# Patient Record
Sex: Female | Born: 2005 | Race: White | Hispanic: No | Marital: Single | Smoking: Never smoker
Health system: Southern US, Community
[De-identification: ages and names within clinical notes are randomized; demographics above are authoritative.]

## PROBLEM LIST (undated history)

## (undated) DIAGNOSIS — J45909 Unspecified asthma, uncomplicated: Secondary | ICD-10-CM

## (undated) DIAGNOSIS — F419 Anxiety disorder, unspecified: Secondary | ICD-10-CM

## (undated) DIAGNOSIS — F32A Depression, unspecified: Secondary | ICD-10-CM

## (undated) HISTORY — DX: Anxiety disorder, unspecified: F41.9

## (undated) HISTORY — DX: Unspecified asthma, uncomplicated: J45.909

## (undated) HISTORY — PX: TONSILLECTOMY AND ADENOIDECTOMY: SUR1326

## (undated) HISTORY — DX: Depression, unspecified: F32.A

---
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
## 2023-05-14 DIAGNOSIS — F419 Anxiety disorder, unspecified: Secondary | ICD-10-CM | POA: Diagnosis not present

## 2023-05-14 DIAGNOSIS — F324 Major depressive disorder, single episode, in partial remission: Secondary | ICD-10-CM | POA: Diagnosis not present

## 2023-06-03 DIAGNOSIS — J029 Acute pharyngitis, unspecified: Secondary | ICD-10-CM | POA: Diagnosis not present

## 2023-06-03 DIAGNOSIS — Z1330 Encounter for screening examination for mental health and behavioral disorders, unspecified: Secondary | ICD-10-CM | POA: Diagnosis not present

## 2023-06-24 DIAGNOSIS — N946 Dysmenorrhea, unspecified: Secondary | ICD-10-CM | POA: Diagnosis not present

## 2023-06-24 DIAGNOSIS — F324 Major depressive disorder, single episode, in partial remission: Secondary | ICD-10-CM | POA: Diagnosis not present

## 2023-06-24 DIAGNOSIS — Z30011 Encounter for initial prescription of contraceptive pills: Secondary | ICD-10-CM | POA: Diagnosis not present

## 2023-06-24 DIAGNOSIS — F419 Anxiety disorder, unspecified: Secondary | ICD-10-CM | POA: Diagnosis not present

## 2023-07-08 ENCOUNTER — Encounter: Payer: Self-pay | Admitting: Gastroenterology

## 2023-07-08 ENCOUNTER — Ambulatory Visit (INDEPENDENT_AMBULATORY_CARE_PROVIDER_SITE_OTHER): Payer: BC Managed Care – PPO | Admitting: Gastroenterology

## 2023-07-08 ENCOUNTER — Other Ambulatory Visit (INDEPENDENT_AMBULATORY_CARE_PROVIDER_SITE_OTHER): Payer: BC Managed Care – PPO

## 2023-07-08 VITALS — BP 100/60 | HR 87 | Ht 68.0 in | Wt 150.0 lb

## 2023-07-08 DIAGNOSIS — R109 Unspecified abdominal pain: Secondary | ICD-10-CM | POA: Diagnosis not present

## 2023-07-08 DIAGNOSIS — R1030 Lower abdominal pain, unspecified: Secondary | ICD-10-CM | POA: Diagnosis not present

## 2023-07-08 DIAGNOSIS — R194 Change in bowel habit: Secondary | ICD-10-CM

## 2023-07-08 DIAGNOSIS — R195 Other fecal abnormalities: Secondary | ICD-10-CM | POA: Insufficient documentation

## 2023-07-08 DIAGNOSIS — R110 Nausea: Secondary | ICD-10-CM | POA: Diagnosis not present

## 2023-07-08 DIAGNOSIS — Z8379 Family history of other diseases of the digestive system: Secondary | ICD-10-CM

## 2023-07-08 LAB — CBC
HCT: 37.3 % (ref 36.0–49.0)
Hemoglobin: 12.7 g/dL (ref 12.0–16.0)
MCHC: 34 g/dL (ref 31.0–37.0)
MCV: 86 fL (ref 78.0–98.0)
Platelets: 226 10*3/uL (ref 150.0–575.0)
RBC: 4.33 Mil/uL (ref 3.80–5.70)
RDW: 12.9 % (ref 11.4–15.5)
WBC: 4.9 10*3/uL (ref 4.5–13.5)

## 2023-07-08 LAB — AMYLASE: Amylase: 52 U/L (ref 27–131)

## 2023-07-08 LAB — COMPREHENSIVE METABOLIC PANEL
ALT: 10 U/L (ref 0–35)
AST: 16 U/L (ref 0–37)
Albumin: 4.4 g/dL (ref 3.5–5.2)
Alkaline Phosphatase: 46 U/L — ABNORMAL LOW (ref 47–119)
BUN: 10 mg/dL (ref 6–23)
CO2: 28 meq/L (ref 19–32)
Calcium: 9.2 mg/dL (ref 8.4–10.5)
Chloride: 103 meq/L (ref 96–112)
Creatinine, Ser: 0.89 mg/dL (ref 0.40–1.20)
GFR: 94.22 mL/min (ref >60.00)
Glucose, Bld: 87 mg/dL (ref 70–99)
Potassium: 3.8 meq/L (ref 3.5–5.1)
Sodium: 138 meq/L (ref 135–145)
Total Bilirubin: 0.4 mg/dL (ref 0.2–1.2)
Total Protein: 7.2 g/dL (ref 6.0–8.3)

## 2023-07-08 LAB — TSH: TSH: 0.86 u[IU]/mL (ref 0.40–5.00)

## 2023-07-08 LAB — LIPASE: Lipase: 25 U/L (ref 11.0–59.0)

## 2023-07-08 MED ORDER — HYOSCYAMINE SULFATE 0.125 MG SL SUBL: 0.1250 mg | SUBLINGUAL_TABLET | Freq: Three times a day (TID) | SUBLINGUAL | 1 refills | Status: DC | PRN

## 2023-07-08 NOTE — Patient Instructions (Signed)
You have been scheduled for an abdominal ultrasound at [facility] (1st floor of hospital) on 07/10/23 at 8:00 am . Please arrive 30 minutes prior to your appointment for registration. Make certain not to have anything to eat or drink 6 hours prior to your appointment. Should you need to reschedule your appointment, please contact radiology at [phone]. This test typically takes about 30 minutes to perform.  Your provider has requested that you go to the basement level for lab work before leaving today. Press "B" on the elevator. The lab is located at the first door on the left as you exit the elevator.  Your provider has ordered "Diatherix" stool testing for you. You have received a kit from our office today containing all necessary supplies to complete this test. Please carefully read the stool collection instructions provided in the kit before opening the accompanying materials. In addition, be sure to place the label from the top right corner of the laboratory request sheet onto the "puritan opti-swab" tube that is supplied in the kit. This label should include your full name and date of birth. After completing the test, you should secure the purtian tube into the specimen biohazard bag. The laboratory request information sheet (including date and time of specimen collection) should be placed into the outside pocket of the specimen biohazard bag and returned to the [place] lab with 2 days of collection.   If the laboratory information sheet specimen date and time are not filled out, the test will NOT be performed.  Due to recent changes in healthcare laws, you may see the results of your imaging and laboratory studies on MyChart before your provider has had a chance to review them.  We understand that in some cases there may be results that are confusing or concerning to you. Not all laboratory results come back in the same time frame and the provider may be waiting for multiple results in  order to interpret others.  Please give [place] 48 hours in order for your provider to thoroughly review all the results before contacting the office for clarification of your results.   _______________________________________________________  If your blood pressure at your visit was 140/90 or greater, please contact your primary care physician to follow up on this.  _______________________________________________________  If you are age 65 or older, your body mass index should be between 23-30. Your Body mass index is 22.81 kg/m. If this is out of the aforementioned range listed, please consider follow up with your Primary Care Provider.  If you are age 65 or younger, your body mass index should be between 19-25. Your Body mass index is 22.81 kg/m. If this is out of the aformentioned range listed, please consider follow up with your Primary Care Provider.   ________________________________________________________  The [facility] providers would like to encourage you to use [facility] to communicate with providers for non-urgent requests or questions.  Due to long hold times on the telephone, sending your provider a message by [facility] may be a faster and more efficient way to get a response.  Please allow 48 business hours for a response.  Please remember that this is for non-urgent requests.  _______________________________________________________  Thank you for choosing me and [facility].  Dr. Meridee Score

## 2023-07-08 NOTE — Progress Notes (Signed)
GASTROENTEROLOGY OUTPATIENT CLINIC VISIT   Primary Care Provider [place], Victorino Dike, MD [address] [place] [address] [phone]  Referring Provider Ronney Asters, MD [address] [place],  [place] [address] [phone]  Patient Profile: Lindsay Lester is a 17 y.o. female with a pmh significant for anxiety, MDD, allergies.  The patient presents to the [facility] for an evaluation and management of problem(s) noted below:  Problem List 1. Lower abdominal pain   2. Abdominal cramping   3. Dark stools   4. Change in bowel habits   5. Nausea   6. Family history of celiac disease    Discussed the use of AI scribe software for clinical note transcription with the patient, who gave verbal consent to proceed.  History of Present Illness This is the patient's first visit to the [facility] GI clinic.  The patient presents with her father.  She has been experiencing a significant recurrent abdominal discomfort for the last 3-weeks.  She reports that about 1 week prior to Thanksgiving she began to experience pain and cramping in the lower abdomen.  This is different that her "period cramps".  This discomfort could last a few minutes or at times in the evening even a few hours.  She had been taking NSAID therapy for the discomfort of her period pain during this time, but not more than what she does normally.  She noted a change in her bowel habits for which she felt constipation was occurring and she had concerns of diarrhea overflow.  She self-administered MiraLAX and then a couple of enemas with improvement in her bowel passage, but it did not improve her symptoms of pain/discomfort.  PPI OTC trial was performed but not clear it made a difference for her.  She went back to school, feeling somewhat worse with regards to the pain.  She initiated an OCP with her PCP to see if that would help with her period discomfort.  She has experienced at times some nausea  and an episode of vomiting, but this is somewhat improved currently.  Food intake/fasting makes no difference for the pain.  This is located in the lower abdomen without radiation to the flanks or back (right at/below her umbilicus per her notation).  Prior to this episode, the patient states that she had no history of similar abdominal pain, apart from menstrual cramps.  Bloating and abdominal gas are intermittent issues she also still deals with at times.  Her sister was diagnosed with Celiac disease years ago, but patient has never formally been tested per report, and she does not follow a GFD.  Patient is still able to do all of her ADLs and feels that some of these issues may be stress related from life stressors of school and other things, but she wants to be sure nothing else more serious is occurring either, her family agrees.   GI Review of Systems Positive as above Negative for dysphagia, odynophagia, hematochezia, early satiety  Review of Systems General: Denies fevers/chills/weight loss unintentionally HEENT: Denies oral lesions Cardiovascular: Denies chest pain Pulmonary: Denies shortness of breath Gastroenterological: See HPI Genitourinary: Denies darkened urine Hematological: Denies easy bruising/bleeding Endocrine: Denies temperature intolerance Dermatological: Denies jaundice Psychological: Mood is stable   Medications Current Outpatient Medications  Medication Sig Dispense Refill   cetirizine (ZYRTEC) 10 MG tablet Take 10 mg by mouth at bedtime.     FLUoxetine (PROZAC) 40 MG capsule Take 40 mg by mouth daily.     hydrOXYzine (ATARAX)  25 MG tablet Take 25 mg by mouth at bedtime.     hyoscyamine (LEVSIN/SL) 0.125 MG SL tablet Place 1 tablet (0.125 mg total) under the tongue 3 (three) times daily as needed. 20 tablet 1   norgestimate-ethinyl estradiol (ORTHO-CYCLEN) 0.25-35 MG-MCG tablet Take 1 tablet by mouth daily.     No current facility-administered medications for this  visit.    Allergies Allergies  Allergen Reactions   Peanut-Containing Drug Products Anaphylaxis and Nausea And Vomiting    Pt allergic to all nuts!    Histories Past Medical History:  Diagnosis Date   Anxiety    Asthma    Depression    Past Surgical History:  Procedure Laterality Date   TONSILLECTOMY AND ADENOIDECTOMY     as a baby   Social History   Socioeconomic History   Marital status: Single    Spouse name: Not on file   Number of children: 0   Years of education: Not on file   Highest education level: Not on file  Occupational History   Occupation: college student  Tobacco Use   Smoking status: Never   Smokeless tobacco: Never  Vaping Use   Vaping status: Never Used  Substance and Sexual Activity   Alcohol use: No   Drug use: No   Sexual activity: Not on file  Other Topics Concern   Not on file  Social History Narrative   Not on file   Social Determinants of Health   Financial Resource Strain: Not on file  Food Insecurity: Not on file  Transportation Needs: Not on file  Physical Activity: Not on file  Stress: Not on file  Social Connections: Not on file  Intimate Partner Violence: Not on file   Family History  Problem Relation Age of Onset   Celiac disease Sister    Other Sister        allergic to peanuts   Colon cancer Neg Hx    Esophageal cancer Neg Hx    Inflammatory bowel disease Neg Hx    Liver disease Neg Hx    Pancreatic cancer Neg Hx    Rectal cancer Neg Hx    Stomach cancer Neg Hx    I have reviewed her medical, social, and family history in detail and updated the electronic medical record as necessary.    PHYSICAL EXAMINATION  BP 100/60   Pulse 87   Ht 5\' 8"  (1.727 m)   Wt 150 lb (68 kg)   SpO2 97%   BMI 22.81 kg/m  Wt Readings from Last 3 Encounters:  07/08/23 150 lb (68 kg) (82%, Z= 0.91)*  01/26/17 103 lb 6.4 oz (46.9 kg) (61%, Z= 0.28)*  12/27/15 83 lb 8.9 oz (37.9 kg) (41%, Z= -0.22)*   * Growth percentiles are  based on CDC (Girls, 2-20 Years) data.  GEN: NAD, appears stated age, doesn't appear chronically ill, accompanied by father, mother on the phone PSYCH: Cooperative, without pressured speech EYE: Conjunctivae pink, sclerae anicteric ENT: MMM CV: Nontachycardic RESP: No audible wheezing GI: NABS, soft, NT/ND, without rebound or guarding, no HSM appreciated MSK/EXT: No significant lower extremity edema SKIN: No jaundice NEURO:  Alert & Oriented x 3, no focal deficits   REVIEW OF DATA  I reviewed the following data at the time of this encounter:  GI Procedures and Studies  No relevant studies to review  Laboratory Studies  Reviewed those in epic and care everywhere  Imaging Studies  No relevant studies to review   ASSESSMENT  Ms. Tregre is a 17 y.o. female with a pmh significant for anxiety, MDD, allergies.  The patient is seen today for evaluation and management of:  1. Lower abdominal pain   2. Abdominal cramping   3. Dark stools   4. Change in bowel habits   5. Nausea   6. Family history of celiac disease    The patient is hemodynamically stable.  Clinically, this patient has had a significant development of discomfort over the course the last few weeks.  Based on the patient's underlying history, most likely this will end up being some sort of functional abdominal discomfort and pain as a result of underlying life stressors surrounding things.  With that being said things will be monitored over the course the next few weeks while she is off of school and back at home.  I think a diagnostic workup is reasonable.  With the family history of celiac disease, even though patient does not have regular symptoms that are uncomfortable, it is reasonable for her to be screened to rule out celiac.  We are going to do other laboratories to rule out potentially treatable causes from a metabolic standpoint.  With the issues of recent dark stools, consideration of upper endoscopy to rule out  PUD should be had, but she has normalized her bowels at this point and wants to hold on upper endoscopy, for now at least.  Will give patient antispasmodic in effort of trying to see if that will make a difference for her symptoms.  If her laboratories and stool studies and abdominal ultrasound are unremarkable, can consider cross-sectional imaging as well as diagnostic endoscopy.  Not clear diagnostic colonoscopy is required at this time unless changes in bowel continues to be an issue.  It will be interesting if patient sees difference being on an OCP at this time.  All patient questions were answered to the best of my ability, and the patient agrees to the aforementioned plan of action with follow-up as indicated.   PLAN  Laboratories as outlined below Abdominal ultrasound to be obtained Stool studies as outlined below Initiate Levsin 0.125 mg 3 times daily as needed abdominal cramping/discomfort - May use a second dose within 15 to 30 minutes of initial dose if needed and if pain is persisting - If helpful, we will send in further prescriptions Diagnostic endoscopy can be considered and was recommended, but patient and family would like to initiate this workup first before pursuing endoscopy - If celiac serologies are elevated, will strongly recommend upper endoscopy Holding on cross-sectional imaging for now No plan for colonoscopy currently Could consider SIBO breath testing in future It will be interesting to see what happens with patient now being on an OCP if this makes any difference for her   Orders Placed This Encounter  Procedures   US Abdomen Complete   CBC   Comp Met (CMET)   Amylase   Lipase   TSH   IgA   Tissue transglutaminase, IgA   Pancreatic elastase, fecal   Other/Misc lab test    New Prescriptions   HYOSCYAMINE (LEVSIN/SL) 0.125 MG SL TABLET    Place 1 tablet (0.125 mg total) under the tongue 3 (three) times daily as needed.   Modified Medications   No  medications on file    Planned Follow Up No follow-ups on file.   Total Time in Face-to-Face and in Coordination of Care for patient including independent/personal interpretation/review of prior testing, medical history, examination, medication adjustment, communicating results  with the patient directly, and documentation within the EHR is 45 minutes.   Corliss Parish, MD [facility] Office # [phone]

## 2023-07-09 LAB — IGA: Immunoglobulin A: 163 mg/dL (ref 47–310)

## 2023-07-09 LAB — TISSUE TRANSGLUTAMINASE, IGA: (tTG) Ab, IgA: <1 U/mL

## 2023-07-10 ENCOUNTER — Ambulatory Visit (HOSPITAL_COMMUNITY)
Admission: RE | Admit: 2023-07-10 | Discharge: 2023-07-10 | Disposition: A | Discharge status: Home / Self Care | Payer: BC Managed Care – PPO | Source: Ambulatory Visit | Attending: Gastroenterology | Admitting: Gastroenterology

## 2023-07-10 ENCOUNTER — Encounter: Payer: Self-pay | Admitting: Gastroenterology

## 2023-07-10 DIAGNOSIS — Z8379 Family history of other diseases of the digestive system: Secondary | ICD-10-CM

## 2023-07-10 DIAGNOSIS — R194 Change in bowel habit: Secondary | ICD-10-CM

## 2023-07-10 DIAGNOSIS — R195 Other fecal abnormalities: Secondary | ICD-10-CM | POA: Diagnosis not present

## 2023-07-10 DIAGNOSIS — R110 Nausea: Secondary | ICD-10-CM | POA: Diagnosis not present

## 2023-07-10 DIAGNOSIS — R1030 Lower abdominal pain, unspecified: Secondary | ICD-10-CM | POA: Diagnosis not present

## 2023-07-10 DIAGNOSIS — R109 Unspecified abdominal pain: Secondary | ICD-10-CM | POA: Diagnosis not present

## 2023-07-11 ENCOUNTER — Encounter (HOSPITAL_COMMUNITY): Payer: Self-pay | Admitting: Emergency Medicine

## 2023-07-11 ENCOUNTER — Emergency Department (HOSPITAL_COMMUNITY): Payer: BC Managed Care – PPO

## 2023-07-11 ENCOUNTER — Encounter: Payer: Self-pay | Admitting: Gastroenterology

## 2023-07-11 ENCOUNTER — Other Ambulatory Visit: Payer: Self-pay

## 2023-07-11 ENCOUNTER — Emergency Department (HOSPITAL_COMMUNITY)
Admission: EM | Admit: 2023-07-11 | Discharge: 2023-07-11 | Disposition: A | Discharge status: Home / Self Care | Payer: BC Managed Care – PPO | Attending: Emergency Medicine | Admitting: Emergency Medicine

## 2023-07-11 DIAGNOSIS — Z91010 Allergy to peanuts: Secondary | ICD-10-CM | POA: Diagnosis not present

## 2023-07-11 DIAGNOSIS — R112 Nausea with vomiting, unspecified: Secondary | ICD-10-CM | POA: Insufficient documentation

## 2023-07-11 DIAGNOSIS — R1031 Right lower quadrant pain: Secondary | ICD-10-CM | POA: Diagnosis not present

## 2023-07-11 DIAGNOSIS — R1011 Right upper quadrant pain: Secondary | ICD-10-CM | POA: Insufficient documentation

## 2023-07-11 DIAGNOSIS — R1030 Lower abdominal pain, unspecified: Secondary | ICD-10-CM

## 2023-07-11 LAB — COMPREHENSIVE METABOLIC PANEL
ALT: 14 U/L (ref 0–44)
AST: 18 U/L (ref 15–41)
Albumin: 4.2 g/dL (ref 3.5–5.0)
Alkaline Phosphatase: 46 U/L (ref 38–126)
Anion gap: 11 (ref 5–15)
BUN: 10 mg/dL (ref 6–20)
CO2: 25 mmol/L (ref 22–32)
Calcium: 9.5 mg/dL (ref 8.9–10.3)
Chloride: 104 mmol/L (ref 98–111)
Creatinine, Ser: 0.95 mg/dL (ref 0.44–1.00)
GFR, Estimated: >60 mL/min (ref >60)
Glucose, Bld: 100 mg/dL — ABNORMAL HIGH (ref 70–99)
Potassium: 3.7 mmol/L (ref 3.5–5.1)
Sodium: 140 mmol/L (ref 135–145)
Total Bilirubin: 0.8 mg/dL (ref <1.2)
Total Protein: 7.4 g/dL (ref 6.5–8.1)

## 2023-07-11 LAB — WET PREP, GENITAL
Clue Cells Wet Prep HPF POC: NONE SEEN
Sperm: NONE SEEN
Trich, Wet Prep: NONE SEEN
WBC, Wet Prep HPF POC: >=10 — AB (ref <10)
Yeast Wet Prep HPF POC: NONE SEEN

## 2023-07-11 LAB — URINALYSIS, ROUTINE W REFLEX MICROSCOPIC
Bilirubin Urine: NEGATIVE
Glucose, UA: NEGATIVE mg/dL
Hgb urine dipstick: NEGATIVE
Ketones, ur: NEGATIVE mg/dL
Leukocytes,Ua: NEGATIVE
Nitrite: NEGATIVE
Protein, ur: NEGATIVE mg/dL
Specific Gravity, Urine: >1.03 — ABNORMAL HIGH (ref 1.005–1.030)
pH: 6 (ref 5.0–8.0)

## 2023-07-11 LAB — CBC
HCT: 37.9 % (ref 36.0–46.0)
Hemoglobin: 13.1 g/dL (ref 12.0–15.0)
MCH: 29.4 pg (ref 26.0–34.0)
MCHC: 34.6 g/dL (ref 30.0–36.0)
MCV: 85 fL (ref 80.0–100.0)
Platelets: 244 10*3/uL (ref 150–400)
RBC: 4.46 MIL/uL (ref 3.87–5.11)
RDW: 12.3 % (ref 11.5–15.5)
WBC: 7.1 10*3/uL (ref 4.0–10.5)
nRBC: 0 % (ref 0.0–0.2)

## 2023-07-11 LAB — LIPASE, BLOOD: Lipase: 36 U/L (ref 11–51)

## 2023-07-11 LAB — HCG, SERUM, QUALITATIVE: Preg, Serum: NEGATIVE

## 2023-07-11 MED ORDER — ONDANSETRON HCL 4 MG/2ML IJ SOLN
4.0000 mg | Freq: Once | INTRAMUSCULAR | Status: AC
  Administered 2023-07-11: 4 mg via INTRAVENOUS
  Filled 2023-07-11: qty 2

## 2023-07-11 MED ORDER — ONDANSETRON 4 MG PO TBDP: 4.0000 mg | ORAL_TABLET | Freq: Three times a day (TID) | ORAL | 0 refills | Status: AC | PRN

## 2023-07-11 MED ORDER — DICYCLOMINE HCL 20 MG PO TABS: 20.0000 mg | ORAL_TABLET | Freq: Two times a day (BID) | ORAL | 0 refills | Status: DC

## 2023-07-11 MED ORDER — IOHEXOL 350 MG/ML SOLN
75.0000 mL | Freq: Once | INTRAVENOUS | Status: AC | PRN
  Administered 2023-07-11: 75 mL via INTRAVENOUS

## 2023-07-11 MED ORDER — MORPHINE SULFATE (PF) 4 MG/ML IV SOLN
4.0000 mg | Freq: Once | INTRAVENOUS | Status: AC
Start: 2023-07-11 — End: 2023-07-11
  Administered 2023-07-11: 4 mg via INTRAVENOUS
  Filled 2023-07-11: qty 1

## 2023-07-11 NOTE — ED Notes (Signed)
CT informed IV placed

## 2023-07-11 NOTE — ED Notes (Signed)
Carollee Herter RN educated pt on how to perform self swab. Pt and mom verbalized understanding.

## 2023-07-11 NOTE — ED Notes (Signed)
Patient transported to CT 

## 2023-07-11 NOTE — ED Notes (Signed)
Pt mom would like to know if pt can start Tylenol and Bentyl before leaving hospital. She would like to know if an inflammatory marker would be beneficial for pt care.   RN updated Jame PA

## 2023-07-11 NOTE — ED Provider Notes (Signed)
[place] EMERGENCY DEPARTMENT AT [facility] Provider Note   CSN: [id number] Arrival date & time: 07/11/23  0556     History  Chief Complaint  Patient presents with   Abdominal Pain    Lindsay Lester is a 17 y.o. female with past medical history of abdominal pain.  Patient reports 4 weeks of lower abdominal pain associated with significant nausea and vomiting.  Reports pain is constant with intermittent severity.  Has not noticed any relation to food she is eating.  Pain is not worse with movement.  Pain is described as burning or aching.  Patient has been seen with pediatrics who referred to GI.  GI had ordered right upper quadrant ultrasound to rule out cholecystitis, [place] with normal findings.  Patient was started on Levisn without improvement. Parent in room is concerned over severity of symptoms, seem to be worsening. Woke patient up in the middle of the night.  The only change in symptoms she is noting today is 1 week of vaginal itching.  Denies any bleeding or discharge.  Reports LMP 3 weeks ago. Denies fevers, chills, decreased appetite, chest pain or shortness of breath. Patient states she has never been sexually active.     Abdominal Pain      Home Medications Prior to Admission medications   Medication Sig Start Date End Date Taking? Authorizing Provider  cetirizine (ZYRTEC) 10 MG tablet Take 10 mg by mouth at bedtime.    [provider]  FLUoxetine (PROZAC) 40 MG capsule Take 40 mg by mouth daily.    [provider]  hydrOXYzine (ATARAX) 25 MG tablet Take 25 mg by mouth at bedtime. 06/09/23   [provider]  hyoscyamine (LEVSIN/SL) 0.125 MG SL tablet Place 1 tablet (0.125 mg total) under the tongue 3 (three) times daily as needed. 07/08/23   Mansouraty, Netty Starring., MD  norgestimate-ethinyl estradiol (ORTHO-CYCLEN) 0.25-35 MG-MCG tablet Take 1 tablet by mouth daily. 06/24/23 07/24/23  [provider]      Allergies     Peanut-containing drug products    Review of Systems   Review of Systems  Gastrointestinal:  Positive for abdominal pain.    Physical Exam Updated Vital Signs BP 116/74 (BP Location: Right Arm)   Pulse 60   Temp 98.1 F (36.7 C)   Resp 18   Ht 5\' 8"  (1.727 m)   Wt 68 kg   LMP 06/20/2023   SpO2 100%   BMI 22.81 kg/m  Physical Exam Vitals and nursing note reviewed.  Constitutional:      General: She is not in acute distress.    Appearance: She is not toxic-appearing.     Comments: Tearful in room   HENT:     Head: Normocephalic and atraumatic.  Eyes:     General: No scleral icterus.    Conjunctiva/sclera: Conjunctivae normal.  Cardiovascular:     Rate and Rhythm: Normal rate and regular rhythm.     Pulses: Normal pulses.     Heart sounds: Normal heart sounds.  Pulmonary:     Effort: Pulmonary effort is normal. No respiratory distress.     Breath sounds: Normal breath sounds.  Abdominal:     General: Abdomen is flat. Bowel sounds are normal. There is no distension.     Palpations: Abdomen is soft. There is no mass.     Tenderness: There is abdominal tenderness. There is no right CVA tenderness or left CVA tenderness.     Hernia: No hernia is present.  Musculoskeletal:     Right lower leg: No edema.     Left lower leg: No edema.  Skin:    General: Skin is warm and dry.     Findings: No lesion.  Neurological:     General: No focal deficit present.     Mental Status: She is alert and oriented to person, place, and time. Mental status is at baseline.     ED Results / Procedures / Treatments   Labs (all labs ordered are listed, but only abnormal results are displayed) Labs Reviewed  COMPREHENSIVE METABOLIC PANEL - Abnormal; Notable for the following components:      Result Value   Glucose, Bld 100 (*)    All other components within normal limits  URINALYSIS, ROUTINE W REFLEX MICROSCOPIC - Abnormal; Notable for the following components:   Specific Gravity,  Urine >1.030 (*)    Bacteria, UA RARE (*)    All other components within normal limits  WET PREP, GENITAL  LIPASE, BLOOD  CBC  HCG, SERUM, QUALITATIVE  GC/CHLAMYDIA PROBE AMP ([place]) NOT AT [facility]    EKG None  Radiology    Procedures Procedures    Medications Ordered in ED Medications - No data to display  ED Course/ Medical Decision Making/ A&P                                 Medical Decision Making Amount and/or Complexity of Data Reviewed Labs: ordered. Radiology: ordered.  Risk Prescription drug management.   Lindsay Lester 17 y.o. presented today for abd pain. Working DDx includes, but not limited to, gastroenteritis, colitis, SBO, appendicitis, cholecystitis, hepatobiliary pathology, gastritis, PUD, ACS, dissection, pancreatitis, nephrolithiasis, AAA, UTI, pyelonephritis, ruptured ectopic pregnancy, PID, ovarian torsion.  R/o DDx: These are considered less likely than current impression due to history of present illness, physical exam, labs/imaging findings.  Review of prior external notes: 07/08/2019 for GI visit as well as right upper quadrant ultrasound which is negative  Pmhx: Denies   Unique Tests and My Interpretation:  CBC without leukocytosis no anemia.  Lipase 36.  CMP without electrolyte abnormality, no AKI or anion gap.  Glucose 100.  Urinalysis unremarkable with rare bacteria no hemoglobin negative for nitrates and leukocytes.  hCG is negative.   Wet Prep: Negative  G&C: Pending   Imaging:  CT Abd/Pelvis with contrast: evaluate for structural/surgical etiology of patients' severe abdominal pain.    Problem List / ED Course / Critical interventions / Medication management  Patient reporting to emergency room with abdominal pain.  Patient reports this has been ongoing for 3 to 4 weeks.  Patient had worsening symptoms today.  Lab work here is unremarkable and CT scan without any acute intra-abdominal pathology.  Pain is under control at this  time.  Patient is hemodynamically stable and well-appearing.  Patient has been able to eat and drink.  Does not appear dehydrated.  I discussed treatment plan with mom and patient at bedside.  I also discussed patient's case with my attending ED provider Dr. Theresia Lo who agrees to patient's discharge and close follow-up with GI.  I feel that patient would benefit from having scope with GI and close follow-up with GI.   I ordered medication including Zofran, morphine Reevaluation of the patient after these medicines showed that the patient improved Patients vitals assessed. Upon arrival patient is hemodynamically stable.  I have reviewed the patients home medicines and have made  adjustments as needed    Consult: None  Plan: Patient to follow-up with GI doctor as discussed.  I recommend patient has scope and colonoscopy as well as follow further recommendations by GI doctor.  Patient will call primary care for follow-up in the meantime.  I have sent Tylenol and Bentyl.  I have discussed diet change.  Given return precautions.  Patient is stable for discharge.         Final Clinical Impression(s) / ED Diagnoses Final diagnoses:  Lower abdominal pain    Rx / DC Orders ED Discharge Orders     None         Smitty Knudsen, PA-C 07/11/23 1251    Rexford Maus, DO 07/11/23 1540

## 2023-07-11 NOTE — ED Triage Notes (Signed)
Pt arrives via POV accompanied by mother. C/o intermittent lower abdominal pain that has been ongoing for the past 3-4 weeks that woke her up from her sleep this morning. Pain described as burning, aching, and cramping. Associated with nausea, denies current vomiting, and intermittent diarrhea. Notable vaginal itching x 1 week. No vaginal bleeding/discharge. LMP 3 weeks ago. Has been seen by her pediatrician and [facility] on 12/11. Has started her prescribed Levsin without improvement of abd pain.

## 2023-07-11 NOTE — Discharge Instructions (Addendum)
You were seen in the emergency room today for abdominal pain.  I have sent Zofran to your pharmacy to take as needed.  I have also sent Bentyl for muscle spasms to take as needed however please discontinue the Levsin. Please follow-up with your GI doctor as discussed to review today's visit and to consider scope.  If your symptoms continue, become more severe or you are no longer tolerating eating and drinking please return to emergency room.

## 2023-07-13 ENCOUNTER — Telehealth: Payer: Self-pay | Admitting: Gastroenterology

## 2023-07-13 LAB — GC/CHLAMYDIA PROBE AMP (~~LOC~~) NOT AT ARMC
Chlamydia: NEGATIVE
Comment: NEGATIVE
Comment: NORMAL
Neisseria Gonorrhea: NEGATIVE

## 2023-07-13 NOTE — Telephone Encounter (Signed)
The pt's mother has been advised that the message has been sent to Dr Meridee Score for review and we will reach back out to her as soon as we are able. The pt has been advised of the information and verbalized understanding.

## 2023-07-13 NOTE — Telephone Encounter (Signed)
Patient's mother called to follow up on MyChart messages, stated the symptoms are escalating seeking further advise.

## 2023-07-13 NOTE — Telephone Encounter (Signed)
Dailey Alberson, Please reach out to patient/mother. If they are amenable, based on the progressive symptoms, negative CT scan, we will offer her to move forward with endoscopic evaluation. She can be scheduled, with her changes in bowel habits, and her lower abdominal pain, for both an EGD/Colonoscopy.   Please offer my next available, which should be later this week if I'm not incorrect. Otherwise, if we cannot do together, then we can do 1 at a time. Thanks. GM

## 2023-07-14 ENCOUNTER — Emergency Department (HOSPITAL_COMMUNITY)
Admission: EM | Admit: 2023-07-14 | Discharge: 2023-07-14 | Disposition: A | Discharge status: Home / Self Care | Payer: BC Managed Care – PPO | Attending: Emergency Medicine | Admitting: Emergency Medicine

## 2023-07-14 ENCOUNTER — Emergency Department (HOSPITAL_COMMUNITY): Payer: BC Managed Care – PPO

## 2023-07-14 ENCOUNTER — Encounter: Payer: Self-pay | Admitting: Gastroenterology

## 2023-07-14 ENCOUNTER — Ambulatory Visit (HOSPITAL_COMMUNITY): Payer: BC Managed Care – PPO

## 2023-07-14 ENCOUNTER — Other Ambulatory Visit: Payer: Self-pay

## 2023-07-14 ENCOUNTER — Encounter (HOSPITAL_COMMUNITY): Payer: Self-pay | Admitting: Emergency Medicine

## 2023-07-14 DIAGNOSIS — R1030 Lower abdominal pain, unspecified: Secondary | ICD-10-CM | POA: Diagnosis not present

## 2023-07-14 DIAGNOSIS — R1032 Left lower quadrant pain: Secondary | ICD-10-CM | POA: Diagnosis not present

## 2023-07-14 DIAGNOSIS — R102 Pelvic and perineal pain: Secondary | ICD-10-CM | POA: Diagnosis not present

## 2023-07-14 DIAGNOSIS — R109 Unspecified abdominal pain: Secondary | ICD-10-CM | POA: Diagnosis not present

## 2023-07-14 DIAGNOSIS — J45909 Unspecified asthma, uncomplicated: Secondary | ICD-10-CM | POA: Insufficient documentation

## 2023-07-14 DIAGNOSIS — R195 Other fecal abnormalities: Secondary | ICD-10-CM

## 2023-07-14 DIAGNOSIS — Z91010 Allergy to peanuts: Secondary | ICD-10-CM | POA: Insufficient documentation

## 2023-07-14 DIAGNOSIS — R110 Nausea: Secondary | ICD-10-CM

## 2023-07-14 DIAGNOSIS — Z8379 Family history of other diseases of the digestive system: Secondary | ICD-10-CM

## 2023-07-14 DIAGNOSIS — R194 Change in bowel habit: Secondary | ICD-10-CM

## 2023-07-14 LAB — CBC WITH DIFFERENTIAL/PLATELET
Abs Immature Granulocytes: 0.03 10*3/uL (ref 0.00–0.07)
Basophils Absolute: 0.1 10*3/uL (ref 0.0–0.1)
Basophils Relative: 1 %
Eosinophils Absolute: 0.1 10*3/uL (ref 0.0–0.5)
Eosinophils Relative: 1 %
HCT: 40.3 % (ref 36.0–46.0)
Hemoglobin: 12.8 g/dL (ref 12.0–15.0)
Immature Granulocytes: 1 %
Lymphocytes Relative: 32 %
Lymphs Abs: 1.8 10*3/uL (ref 0.7–4.0)
MCH: 29.2 pg (ref 26.0–34.0)
MCHC: 31.8 g/dL (ref 30.0–36.0)
MCV: 91.8 fL (ref 80.0–100.0)
Monocytes Absolute: 0.5 10*3/uL (ref 0.1–1.0)
Monocytes Relative: 8 %
Neutro Abs: 3.4 10*3/uL (ref 1.7–7.7)
Neutrophils Relative %: 57 %
Platelets: 209 10*3/uL (ref 150–400)
RBC: 4.39 MIL/uL (ref 3.87–5.11)
RDW: 12.1 % (ref 11.5–15.5)
WBC: 5.8 10*3/uL (ref 4.0–10.5)
nRBC: 0 % (ref 0.0–0.2)

## 2023-07-14 LAB — URINALYSIS, ROUTINE W REFLEX MICROSCOPIC
Bilirubin Urine: NEGATIVE
Glucose, UA: NEGATIVE mg/dL
Hgb urine dipstick: NEGATIVE
Ketones, ur: 5 mg/dL — AB
Leukocytes,Ua: NEGATIVE
Nitrite: NEGATIVE
Protein, ur: 100 mg/dL — AB
Specific Gravity, Urine: 1.038 — ABNORMAL HIGH (ref 1.005–1.030)
pH: 5 (ref 5.0–8.0)

## 2023-07-14 LAB — COMPREHENSIVE METABOLIC PANEL
ALT: 13 U/L (ref 0–44)
AST: 21 U/L (ref 15–41)
Albumin: 4.3 g/dL (ref 3.5–5.0)
Alkaline Phosphatase: 41 U/L (ref 38–126)
Anion gap: 8 (ref 5–15)
BUN: 10 mg/dL (ref 6–20)
CO2: 22 mmol/L (ref 22–32)
Calcium: 9.2 mg/dL (ref 8.9–10.3)
Chloride: 107 mmol/L (ref 98–111)
Creatinine, Ser: 0.8 mg/dL (ref 0.44–1.00)
GFR, Estimated: >60 mL/min (ref >60)
Glucose, Bld: 101 mg/dL — ABNORMAL HIGH (ref 70–99)
Potassium: 3.7 mmol/L (ref 3.5–5.1)
Sodium: 137 mmol/L (ref 135–145)
Total Bilirubin: 0.5 mg/dL (ref <1.2)
Total Protein: 7.5 g/dL (ref 6.5–8.1)

## 2023-07-14 LAB — PANCREATIC ELASTASE, FECAL: Pancreatic Elastase-1, Stool: >800 ug/g (ref >200)

## 2023-07-14 LAB — LIPASE, BLOOD: Lipase: 32 U/L (ref 11–51)

## 2023-07-14 LAB — PREGNANCY, URINE: Preg Test, Ur: NEGATIVE

## 2023-07-14 MED ORDER — NAPROXEN 500 MG PO TABS: 500.0000 mg | ORAL_TABLET | Freq: Two times a day (BID) | ORAL | 0 refills | Status: AC

## 2023-07-14 MED ORDER — ONDANSETRON 4 MG PO TBDP: 4.0000 mg | ORAL_TABLET | Freq: Once | ORAL | Status: DC

## 2023-07-14 MED ORDER — NA SULFATE-K SULFATE-MG SULF 17.5-3.13-1.6 GM/177ML PO SOLN: 1.0000 | Freq: Once | ORAL | 0 refills | Status: AC

## 2023-07-14 MED ORDER — KETOROLAC TROMETHAMINE 15 MG/ML IJ SOLN
15.0000 mg | Freq: Once | INTRAMUSCULAR | Status: AC
  Administered 2023-07-14: 15 mg via INTRAMUSCULAR

## 2023-07-14 MED ORDER — KETOROLAC TROMETHAMINE 15 MG/ML IJ SOLN
15.0000 mg | Freq: Once | INTRAMUSCULAR | Status: DC
  Filled 2023-07-14: qty 1

## 2023-07-14 MED ORDER — IOHEXOL 300 MG/ML  SOLN
100.0000 mL | Freq: Once | INTRAMUSCULAR | Status: AC | PRN
  Administered 2023-07-14: 100 mL via INTRAVENOUS

## 2023-07-14 MED ORDER — FENTANYL CITRATE PF 50 MCG/ML IJ SOSY
50.0000 ug | PREFILLED_SYRINGE | Freq: Once | INTRAMUSCULAR | Status: AC
  Administered 2023-07-14: 50 ug via INTRAMUSCULAR
  Filled 2023-07-14: qty 1

## 2023-07-14 MED ORDER — ONDANSETRON HCL 4 MG/2ML IJ SOLN
INTRAMUSCULAR | Status: AC
  Filled 2023-07-14: qty 2

## 2023-07-14 MED ORDER — OXYCODONE-ACETAMINOPHEN 5-325 MG PO TABS: 1.0000 | ORAL_TABLET | Freq: Once | ORAL | Status: DC

## 2023-07-14 NOTE — ED Notes (Signed)
Patient transported to Ultrasound 

## 2023-07-14 NOTE — ED Notes (Signed)
Pt discharged home. Discharge information discussed. No s/s of distress observed during discharge. 

## 2023-07-14 NOTE — ED Notes (Signed)
2 attempts at IV unsuccessful.  

## 2023-07-14 NOTE — ED Provider Triage Note (Signed)
Emergency Medicine Provider Triage Evaluation Note  Lindsay Lester , a 17 y.o. female  was evaluated in triage.  Pt complains of abdominal pain.  Patient was seen recently for similar concerns abdominal pain in the emergency department with reassuring workup.  Patient has also been seen by GI for similar concerns and no specific findings have been noted.  Patient had stool testing performed but results are pending.  Denies any notable vomiting or diarrhea but does states she has some nausea due to the pain.  No recent fever, chills or bodyaches.  Review of Systems  Positive: As above Negative: As above  Physical Exam  BP 133/75 (BP Location: Left Arm)   Pulse 63   Temp 98.2 F (36.8 C) (Oral)   Resp 16   LMP 06/20/2023   SpO2 100%  Gen:   Awake, clearly uncomfortable Resp:  Normal effort  MSK:   Moves extremities without difficulty  Other:  No focal abdominal tenderness on palpation.  Normal bowel sounds.  Medical Decision Making  Medically screening exam initiated at 5:28 AM.  Appropriate orders placed.  Belkis Reeve was informed that the remainder of the evaluation will be completed by another provider, this initial triage assessment does not replace that evaluation, and the importance of remaining in the ED until their evaluation is complete.     Smitty Knudsen, PA-C 07/14/23 [phone]

## 2023-07-14 NOTE — Telephone Encounter (Signed)
Spoke to the pt mother and she agrees to the endo colon for 12/20. Instructions sent to her My Chart- prep sent.  She will call if she has any questions or concerns.  Referral entered

## 2023-07-14 NOTE — Telephone Encounter (Signed)
PT mother returning call to discuss daughter being hospitalized. Please advise

## 2023-07-14 NOTE — Telephone Encounter (Signed)
Dailey Alberson, Please reach out to patient/mother. If they are amenable, based on the progressive symptoms, negative CT scan, we will offer her to move forward with endoscopic evaluation. She can be scheduled, with her changes in bowel habits, and her lower abdominal pain, for both an EGD/Colonoscopy.   Please offer my next available, which should be later this week if I'm not incorrect. Otherwise, if we cannot do together, then we can do 1 at a time. Thanks. GM

## 2023-07-14 NOTE — ED Notes (Signed)
Pt reports pain meds do seem to be helping- taking the edge off.

## 2023-07-14 NOTE — Discharge Instructions (Addendum)
It was a pleasure taking care of you today.  As discussed, your CT scan did not show any acute abnormalities. Your pelvic ultrasound was normal.   Report to your OB/GYN appointment this afternoon for further evaluation.  Also report to your GI appointment on Friday.  I am sending you home with naproxen.  You may take twice a day as needed for pain.  Return to the ER for new or worsening symptoms.

## 2023-07-14 NOTE — ED Triage Notes (Signed)
Pt reports belly pain and nausea starting this morning around 0400. Pt is retching but no emesis. Nothing makes the pain better or worse. Pt is crying. Pt taking tylenol and it is not helping.

## 2023-07-14 NOTE — ED Provider Notes (Signed)
[facility] AT [facility] Provider Note   CSN: [id number] Arrival date & time: 07/14/23  0454     History  Chief Complaint  Patient presents with   Abdominal Pain    Lindsay Lester is a 17 y.o. female with a past medical history significant for anxiety, depression, and asthma who presents to the ED due to worsening lower abdominal pain.  Patient states abdominal pain has been intermittent for the past 3 to 4 weeks.  Patient has been seen by GI and in the ED a few days ago with reassuring workup.  Patient states pain has worsened over the past 4 days.  Patient tearful during initial evaluation due to significant lower quadrant pain.  No known triggers.  Patient states pain becomes so severe she becomes nauseous.  No vomiting or diarrhea.  Has been battling some constipation where she took some MiraLAX.  CT abdomen on 12/14 was negative for any acute abnormalities.  GI felt abdominal pain was possibly functional abdominal pain secondary to underlying life stressors.  No previous abdominal operations.  Never been sexually active.  No vaginal discharge.  No urinary symptoms.  No fever or chills.  Mother at bedside.  History obtained from patient and past medical records. No interpreter used during encounter.       Home Medications Prior to Admission medications   Medication Sig Start Date End Date Taking? Authorizing Provider  naproxen (NAPROSYN) 500 MG tablet Take 1 tablet (500 mg total) by mouth 2 (two) times daily. 07/14/23  Yes Ajna Moors, Merla Riches, PA-C  cetirizine (ZYRTEC) 10 MG tablet Take 10 mg by mouth at bedtime.    [provider]  dicyclomine (BENTYL) 20 MG tablet Take 1 tablet (20 mg total) by mouth 2 (two) times daily. 07/11/23   Barrett, Horald Chestnut, PA-C  FLUoxetine (PROZAC) 40 MG capsule Take 40 mg by mouth daily.    [provider]  hydrOXYzine (ATARAX) 25 MG tablet Take 25 mg by mouth at bedtime. 06/09/23   [provider]  Na Sulfate-K Sulfate-Mg Sulf 17.5-3.13-1.6 GM/177ML SOLN Take 1 kit by mouth once for 1 dose. 07/14/23 07/14/23  Mansouraty, Netty Starring., MD  norgestimate-ethinyl estradiol (ORTHO-CYCLEN) 0.25-35 MG-MCG tablet Take 1 tablet by mouth daily. 06/24/23 07/24/23  [provider]  ondansetron (ZOFRAN-ODT) 4 MG disintegrating tablet Take 1 tablet (4 mg total) by mouth every 8 (eight) hours as needed for nausea or vomiting. 07/11/23   Barrett, Horald Chestnut, PA-C      Allergies    Peanut-containing drug products    Review of Systems   Review of Systems  Gastrointestinal:  Positive for abdominal pain and nausea. Negative for diarrhea and vomiting.  Genitourinary:  Negative for dysuria and vaginal discharge.    Physical Exam Updated Vital Signs BP (!) 103/55   Pulse (!) 52   Temp 98.5 F (36.9 C) (Oral)   Resp 14   LMP 06/20/2023   SpO2 99%  Physical Exam Vitals and nursing note reviewed.  Constitutional:      General: She is not in acute distress.    Appearance: She is not ill-appearing.     Comments: Tearful. Appears uncomfortably in bed  HENT:     Head: Normocephalic.  Eyes:     Pupils: Pupils are equal, round, and reactive to light.  Cardiovascular:     Rate and Rhythm: Normal rate and regular rhythm.     Pulses: Normal pulses.     Heart sounds: Normal  heart sounds. No murmur heard.    No friction rub. No gallop.  Pulmonary:     Effort: Pulmonary effort is normal.     Breath sounds: Normal breath sounds.  Abdominal:     General: Abdomen is flat. There is no distension.     Palpations: Abdomen is soft.     Tenderness: There is abdominal tenderness. There is no guarding or rebound.     Comments: Some lower quadrant tenderness, most significant in LLQ.   Musculoskeletal:        General: Normal range of motion.     Cervical back: Neck supple.  Skin:    General: Skin is warm and dry.  Neurological:     General: No focal deficit present.     Mental Status:  She is alert.  Psychiatric:        Mood and Affect: Mood normal.        Behavior: Behavior normal.     ED Results / Procedures / Treatments   Labs (all labs ordered are listed, but only abnormal results are displayed) Labs Reviewed  COMPREHENSIVE METABOLIC PANEL - Abnormal; Notable for the following components:      Result Value   Glucose, Bld 101 (*)    All other components within normal limits  URINALYSIS, ROUTINE W REFLEX MICROSCOPIC - Abnormal; Notable for the following components:   APPearance HAZY (*)    Specific Gravity, Urine 1.038 (*)    Ketones, ur 5 (*)    Protein, ur 100 (*)    Bacteria, UA RARE (*)    All other components within normal limits  CBC WITH DIFFERENTIAL/PLATELET  LIPASE, BLOOD  PREGNANCY, URINE    EKG None  Radiology CT ABDOMEN PELVIS W CONTRAST Result Date: 07/14/2023 CLINICAL DATA:  Left lower quadrant abdominal pain and nausea. EXAM: CT ABDOMEN AND PELVIS WITH CONTRAST TECHNIQUE: Multidetector CT imaging of the abdomen and pelvis was performed using the standard protocol following bolus administration of intravenous contrast. RADIATION DOSE REDUCTION: This exam was performed according to the departmental dose-optimization program which includes automated exposure control, adjustment of the mA and/or kV according to patient size and/or use of iterative reconstruction technique. CONTRAST:  OMNIPAQUE IOHEXOL 300 MG/ML  SOLN COMPARISON:  07/11/2023 FINDINGS: Lower chest: No acute abnormality. Hepatobiliary: No focal liver abnormality is seen. No gallstones, gallbladder wall thickening, or biliary dilatation. Pancreas: Unremarkable. No pancreatic ductal dilatation or surrounding inflammatory changes. Spleen: Normal in size without focal abnormality. Adrenals/Urinary Tract: Adrenal glands are unremarkable. Kidneys are normal, without renal calculi, focal lesion, or hydronephrosis. Bladder is unremarkable. Stomach/Bowel: Bowel shows no evidence of  obstruction, ileus, inflammation or lesion. The appendix is difficult to discretely visualized but there is no evidence to suggest acute appendicitis. No free intraperitoneal air. Vascular/Lymphatic: No significant vascular findings are present. No enlarged abdominal or pelvic lymph nodes. Reproductive: Uterus and bilateral adnexa are unremarkable. Other: No abdominal wall hernia or abnormality. No abscess or ascites. Musculoskeletal: No acute or significant osseous findings. IMPRESSION: No acute findings in the abdomen or pelvis. Electronically Signed   By: Irish Lack M.D.   On: 07/14/2023 14:26   US Pelvis Complete Result Date: 07/14/2023 CLINICAL DATA:  Pelvic pain. EXAM: TRANSABDOMINAL ULTRASOUND OF PELVIS DOPPLER ULTRASOUND OF OVARIES TECHNIQUE: Transabdominal ultrasound examination of the pelvis was performed including evaluation of the uterus, ovaries, adnexal regions, and pelvic cul-de-sac. Color and duplex Doppler ultrasound was utilized to evaluate blood flow to the ovaries. COMPARISON:  CT scan abdomen and pelvis  from 07/11/2023. FINDINGS: Uterus Measurements: 3.7 x 5.0 x 7.3 cm = volume: 70.8 mL. No fibroids or other mass visualized. Endometrium Thickness: 8.0.  No focal abnormality visualized. Right ovary Measurements: 1.7 x 2.9 x 4.1 cm. = volume: 10.5 mL. Normal appearance/no adnexal mass. Left ovary Measurements: 1.6 x 3.0 x 5.3 cm. = volume: 13.3 mL. Normal appearance/no adnexal mass. Pulsed Doppler evaluation demonstrates normal low-resistance arterial and venous waveforms in both ovaries. Other: None. IMPRESSION: *Unremarkable pelvic ultrasound and Doppler. Electronically Signed   By: Jules Schick M.D.   On: 07/14/2023 08:16   US PELVIC DOPPLER (TORSION R/O OR MASS ARTERIAL FLOW) Result Date: 07/14/2023 CLINICAL DATA:  Pelvic pain. EXAM: TRANSABDOMINAL ULTRASOUND OF PELVIS DOPPLER ULTRASOUND OF OVARIES TECHNIQUE: Transabdominal ultrasound examination of the pelvis was performed  including evaluation of the uterus, ovaries, adnexal regions, and pelvic cul-de-sac. Color and duplex Doppler ultrasound was utilized to evaluate blood flow to the ovaries. COMPARISON:  CT scan abdomen and pelvis from 07/11/2023. FINDINGS: Uterus Measurements: 3.7 x 5.0 x 7.3 cm = volume: 70.8 mL. No fibroids or other mass visualized. Endometrium Thickness: 8.0.  No focal abnormality visualized. Right ovary Measurements: 1.7 x 2.9 x 4.1 cm. = volume: 10.5 mL. Normal appearance/no adnexal mass. Left ovary Measurements: 1.6 x 3.0 x 5.3 cm. = volume: 13.3 mL. Normal appearance/no adnexal mass. Pulsed Doppler evaluation demonstrates normal low-resistance arterial and venous waveforms in both ovaries. Other: None. IMPRESSION: *Unremarkable pelvic ultrasound and Doppler. Electronically Signed   By: Jules Schick M.D.   On: 07/14/2023 08:16    Procedures Procedures    Medications Ordered in ED Medications  ondansetron (ZOFRAN) 4 MG/2ML injection (has no administration in time range)  ondansetron (ZOFRAN-ODT) disintegrating tablet 4 mg (has no administration in time range)  fentaNYL (SUBLIMAZE) injection 50 mcg (50 mcg Intramuscular Given 07/14/23 0607)  iohexol (OMNIPAQUE) 300 MG/ML solution 100 mL (100 mLs Intravenous Contrast Given 07/14/23 1155)  ketorolac (TORADOL) 15 MG/ML injection 15 mg (15 mg Intramuscular Given 07/14/23 1103)    ED Course/ Medical Decision Making/ A&P Clinical Course as of 07/14/23 1437  Tue Jul 14, 2023  0840 Preg Test, Ur: NEGATIVE [CA]    Clinical Course User Index [CA] Mannie Stabile, PA-C                                 Medical Decision Making Amount and/or Complexity of Data Reviewed Independent Historian: parent External Data Reviewed: notes. Labs: ordered. Decision-making details documented in ED Course. Radiology: ordered and independent interpretation performed. Decision-making details documented in ED Course.  Risk Prescription drug  management.   This patient presents to the ED for concern of lower abdominal pain, this involves an extensive number of treatment options, and is a complaint that carries with it a high risk of complications and morbidity.  The differential diagnosis includes appendicitis, diverticulitis, ovarian torsion, ovarian cyst, PID, etc  17 year old female presents to the ED due to intermittent lower abdominal pain x 3 to 4 weeks.  Patient has been evaluated by GI and in the ED a few days ago with reassuring workup.  CT abdomen on 12/14 was negative for any acute abnormalities.  Patient states pain has worsened over the past 4 days.  Upon arrival, vitals all within normal limits.  Patient tearful during initial evaluation and appears to be in pain.  Abdomen soft, nondistended with slight lower quadrant tenderness most significant in left lower  quadrant.  Routine labs and pelvic ultrasound ordered in triage.  Pelvic ultrasound personally reviewed and interpreted which is negative for any acute abnormalities.  No evidence of ovarian torsion. I had a long discussion with mother and patient at bedside.  We reviewed CT imaging from 12/14.  Given patient states pain has worsened over the past 4 days offered to repeat CT scan however, warned about risk of radiation.  Mother and patient prefers to repeat CT scan given characteristics of pain have changed over the past few days.  Advised mother to call GYN and GI to see if there is an appointment available this afternoon for further recommendations.  IV Toradol given.  CBC unremarkable.  No leukocytosis.  Normal hemoglobin.  CMP with normal renal function.  No major electrolyte derangements.  UA with rare bacteria, 6-10 white blood cells, no leukocytes or nitrates. Patient denies any urinary symptoms. Low suspicion for acute cystitis. Lipase normal.  Low suspicion for pancreatitis.  Pregnancy test negative.  Low suspicion for ectopic pregnancy.  CT abdomen personally reviewed  and interpreted which is negative for any acute abnormalities. Patient has never been sexually active and has not concerns for STIs, low suspicion for PID. Patient was able to make a GYN appointment for this afternoon and GI appointment for Friday.  Advised patient to report to appointments.  Patient discharged with naproxen.  Patient stable for discharge. Strict ED precautions discussed with patient. Patient states understanding and agrees to plan. Patient discharged home in no acute distress and stable vitals  Co morbidities that complicate the patient evaluation  anxiety  Social Determinants of Health:  PCP on file  Test / Admission - Considered:  -considered admission for intractable abdominal pain; however pain improved after Toradol upon reassessment.          Final Clinical Impression(s) / ED Diagnoses Final diagnoses:  Lower abdominal pain    Rx / DC Orders ED Discharge Orders          Ordered    naproxen (NAPROSYN) 500 MG tablet  2 times daily        07/14/23 1416              Jesusita Oka 07/14/23 1438    Gloris Manchester, MD 07/14/23 1537

## 2023-07-15 ENCOUNTER — Encounter: Payer: Self-pay | Admitting: Gastroenterology

## 2023-07-15 NOTE — Progress Notes (Signed)
Pt advised via My Chart- she does view messages.

## 2023-07-15 NOTE — Progress Notes (Signed)
[facility] H. pylori testing  Negative for H. pylori  Patient will be made aware and a copy sent for her records.

## 2023-07-17 ENCOUNTER — Other Ambulatory Visit: Payer: Self-pay

## 2023-07-17 ENCOUNTER — Ambulatory Visit: Payer: BC Managed Care – PPO | Admitting: Internal Medicine

## 2023-07-17 ENCOUNTER — Encounter: Payer: Self-pay | Admitting: Internal Medicine

## 2023-07-17 VITALS — BP 102/59 | HR 49 | Temp 97.8°F | Resp 17 | Ht 68.0 in | Wt 150.0 lb

## 2023-07-17 DIAGNOSIS — R110 Nausea: Secondary | ICD-10-CM

## 2023-07-17 DIAGNOSIS — K259 Gastric ulcer, unspecified as acute or chronic, without hemorrhage or perforation: Secondary | ICD-10-CM

## 2023-07-17 DIAGNOSIS — K635 Polyp of colon: Secondary | ICD-10-CM | POA: Diagnosis not present

## 2023-07-17 DIAGNOSIS — K2971 Gastritis, unspecified, with bleeding: Secondary | ICD-10-CM

## 2023-07-17 DIAGNOSIS — Z8379 Family history of other diseases of the digestive system: Secondary | ICD-10-CM

## 2023-07-17 DIAGNOSIS — R1030 Lower abdominal pain, unspecified: Secondary | ICD-10-CM

## 2023-07-17 DIAGNOSIS — K2970 Gastritis, unspecified, without bleeding: Secondary | ICD-10-CM

## 2023-07-17 DIAGNOSIS — R194 Change in bowel habit: Secondary | ICD-10-CM

## 2023-07-17 DIAGNOSIS — K648 Other hemorrhoids: Secondary | ICD-10-CM | POA: Diagnosis not present

## 2023-07-17 DIAGNOSIS — D125 Benign neoplasm of sigmoid colon: Secondary | ICD-10-CM

## 2023-07-17 DIAGNOSIS — R195 Other fecal abnormalities: Secondary | ICD-10-CM

## 2023-07-17 MED ORDER — FLUCONAZOLE 100 MG PO TABS: 200.0000 mg | ORAL_TABLET | Freq: Every day | ORAL | 0 refills | Status: AC

## 2023-07-17 MED ORDER — PANTOPRAZOLE SODIUM 40 MG PO TBEC: 40.0000 mg | DELAYED_RELEASE_TABLET | Freq: Every day | ORAL | 0 refills | Status: DC

## 2023-07-17 MED ORDER — SODIUM CHLORIDE 0.9 % IV SOLN: 500.0000 mL | Freq: Once | INTRAVENOUS | Status: DC

## 2023-07-17 NOTE — Patient Instructions (Addendum)
- Use Protonix (pantoprazole) 40 mg PO daily for 8 weeks to aid with healing of gastritis. - Fluconazole 400 mg x 1 dose, followed by 200 mg QD for 13 days for treatment of esophageal candidiasis. - Return to GI clinic in 2-3 months with Dr. Meridee Score or APP.  - Await pathology results. - Patient was noted to have some decreased anal sphincter tone. I would recommend a referral to pelvic floor physical therapy.  YOU HAD AN ENDOSCOPIC PROCEDURE TODAY AT THE [facility]:   Refer to the procedure report that was given to you for any specific questions about what was found during the examination.  If the procedure report does not answer your questions, please call your gastroenterologist to clarify.  If you requested that your care partner not be given the details of your procedure findings, then the procedure report has been included in a sealed envelope for you to review at your convenience later.  YOU SHOULD EXPECT: Some feelings of bloating in the abdomen. Passage of more gas than usual.  Walking can help get rid of the air that was put into your GI tract during the procedure and reduce the bloating. If you had a lower endoscopy (such as a colonoscopy or flexible sigmoidoscopy) you may notice spotting of blood in your stool or on the toilet paper. If you underwent a bowel prep for your procedure, you may not have a normal bowel movement for a few days.  Please Note:  You might notice some irritation and congestion in your nose or some drainage.  This is from the oxygen used during your procedure.  There is no need for concern and it should clear up in a day or so.  SYMPTOMS TO REPORT IMMEDIATELY:  Following lower endoscopy (colonoscopy or flexible sigmoidoscopy):  Excessive amounts of blood in the stool  Significant tenderness or worsening of abdominal pains  Swelling of the abdomen that is new, acute  Fever of 100F or higher  Following upper endoscopy (EGD)  Vomiting of blood or  coffee ground material  New chest pain or pain under the shoulder blades  Painful or persistently difficult swallowing  New shortness of breath  Fever of 100F or higher  Black, tarry-looking stools  For urgent or emergent issues, a gastroenterologist can be reached at any hour by calling [phone]. Do not use MyChart messaging for urgent concerns.    DIET:  We do recommend a small meal at first, but then you may proceed to your regular diet.  Drink plenty of fluids but you should avoid alcoholic beverages for 24 hours.  ACTIVITY:  You should plan to take it easy for the rest of today and you should NOT DRIVE or use heavy machinery until tomorrow (because of the sedation medicines used during the test).    FOLLOW UP: Our staff will call the number listed on your records the next business day following your procedure.  We will call around 7:15- 8:00 am to check on you and address any questions or concerns that you may have regarding the information given to you following your procedure. If we do not reach you, we will leave a message.     If any biopsies were taken you will be contacted by phone or by letter within the next 1-3 weeks.  Please call us at [phone] if you have not heard about the biopsies in 3 weeks.    SIGNATURES/CONFIDENTIALITY: You and/or your care partner have signed paperwork which will  be entered into your electronic medical record.  These signatures attest to the fact that that the information above on your After Visit Summary has been reviewed and is understood.  Full responsibility of the confidentiality of this discharge information lies with you and/or your care-partner.

## 2023-07-17 NOTE — Op Note (Signed)
[facility] Patient Name: Lindsay Lester Procedure Date: 07/17/2023 1:54 PM MRN: [id number] Endoscopist: Particia Lather , , [id number] Age: 17 Referring MD:  Date of Birth: 2005-11-16 Gender: Female Account #: [id number] Procedure:                Upper GI endoscopy Indications:              Generalized abdominal pain, Melena, Nausea, family                            history of celiac disease, change in bowel habits Medicines:                Monitored Anesthesia Care Procedure:                Pre-Anesthesia Assessment:                           - Prior to the procedure, a History and Physical                            was performed, and patient medications and                            allergies were reviewed. The patient's tolerance of                            previous anesthesia was also reviewed. The risks                            and benefits of the procedure and the sedation                            options and risks were discussed with the patient.                            All questions were answered, and informed consent                            was obtained. Prior Anticoagulants: The patient has                            taken no anticoagulant or antiplatelet agents. ASA                            Grade Assessment: II - A patient with mild systemic                            disease. After reviewing the risks and benefits,                            the patient was deemed in satisfactory condition to                            undergo the procedure.  After obtaining informed consent, the endoscope was                            passed under direct vision. Throughout the                            procedure, the patient's blood pressure, pulse, and                            oxygen saturations were monitored continuously. The                            Olympus Scope [id number] was introduced through the                             mouth, and advanced to the second part of duodenum.                            The upper GI endoscopy was accomplished without                            difficulty. The patient tolerated the procedure                            well. Scope In: Scope Out: Findings:                 Localized, white plaques were found in the mid                            esophagus.                           Localized inflammation with hemorrhage                            characterized by congestion (edema), erosions and                            erythema was found in the gastric body and in the                            gastric antrum. Biopsies were taken with a cold                            forceps for histology.                           The examined duodenum was normal. Biopsies were                            taken with a cold forceps for histology. Complications:            No immediate complications. Estimated Blood Loss:     Estimated blood loss was minimal. Impression:               -  Esophageal plaques were found, consistent with                            candidiasis.                           - Gastritis with hemorrhage. Biopsied.                           - Normal examined duodenum. Biopsied. Recommendation:           - Await pathology results.                           - Use Protonix (pantoprazole) 40 mg PO daily for 8                            weeks to aid with healing of gastritis.                           - Fluconazole 400 mg x 1 dose, followed by 200 mg                            QD for 13 days for treatment of esophageal                            candidiasis.                           - Return to GI clinic in 2-3 months with Dr.                            Meridee Score or APP.                           - Perform a colonoscopy today. Dr Particia Lather "Alan Ripper" Leonides Schanz,  07/17/2023 2:32:39 PM

## 2023-07-17 NOTE — Progress Notes (Signed)
Report given to PACU, vss 

## 2023-07-17 NOTE — Progress Notes (Signed)
Called to room to assist during endoscopic procedure.  Patient ID and intended procedure confirmed with present staff. Received instructions for my participation in the procedure from the performing physician.  

## 2023-07-17 NOTE — Progress Notes (Signed)
GASTROENTEROLOGY PROCEDURE H&P NOTE   Primary Care Physician: Ronney Asters, MD    Reason for Procedure:   Lower ab pain, dark stools, nausea, family history of celiac disease, change in bowel habits  Plan:    EGD/colonoscopy  Patient is appropriate for endoscopic procedure(s) in the ambulatory ([facility]) setting.  The nature of the procedure, as well as the risks, benefits, and alternatives were carefully and thoroughly reviewed with the patient. Ample time for discussion and questions allowed. The patient understood, was satisfied, and agreed to proceed.     HPI: Lindsay Lester is a 17 y.o. female who presents for EGD/colonoscopy for evaluation of lower ab pain, dark stools, nausea, family history of celiac disease, change in bowel habits.  Patient was most recently seen in the Gastroenterology Clinic on 07/08/23.  Patient has been to the ED due to her abdominal pain. She was recently put on antibiotics by her OB/GYN due to concern for PID, which has helped with some of her symptoms. Please refer to that note for full details regarding GI history and clinical presentation.   Past Medical History:  Diagnosis Date   Anxiety    Asthma    Depression     Past Surgical History:  Procedure Laterality Date   TONSILLECTOMY AND ADENOIDECTOMY     as a baby    Prior to Admission medications   Medication Sig Start Date End Date Taking? Authorizing Provider  cetirizine (ZYRTEC) 10 MG tablet Take 10 mg by mouth at bedtime.    [provider]  dicyclomine (BENTYL) 20 MG tablet Take 1 tablet (20 mg total) by mouth 2 (two) times daily. 07/11/23   Barrett, Horald Chestnut, PA-C  FLUoxetine (PROZAC) 40 MG capsule Take 40 mg by mouth daily.    [provider]  hydrOXYzine (ATARAX) 25 MG tablet Take 25 mg by mouth at bedtime. 06/09/23   [provider]  naproxen (NAPROSYN) 500 MG tablet Take 1 tablet (500 mg total) by mouth 2 (two) times daily. 07/14/23   Mannie Stabile, PA-C  norgestimate-ethinyl estradiol (ORTHO-CYCLEN) 0.25-35 MG-MCG tablet Take 1 tablet by mouth daily. 06/24/23 07/24/23  [provider]  ondansetron (ZOFRAN-ODT) 4 MG disintegrating tablet Take 1 tablet (4 mg total) by mouth every 8 (eight) hours as needed for nausea or vomiting. 07/11/23   Barrett, Horald Chestnut, PA-C    Current Outpatient Medications  Medication Sig Dispense Refill   cetirizine (ZYRTEC) 10 MG tablet Take 10 mg by mouth at bedtime.     dicyclomine (BENTYL) 20 MG tablet Take 1 tablet (20 mg total) by mouth 2 (two) times daily. 20 tablet 0   FLUoxetine (PROZAC) 40 MG capsule Take 40 mg by mouth daily.     hydrOXYzine (ATARAX) 25 MG tablet Take 25 mg by mouth at bedtime.     naproxen (NAPROSYN) 500 MG tablet Take 1 tablet (500 mg total) by mouth 2 (two) times daily. 30 tablet 0   norgestimate-ethinyl estradiol (ORTHO-CYCLEN) 0.25-35 MG-MCG tablet Take 1 tablet by mouth daily.     ondansetron (ZOFRAN-ODT) 4 MG disintegrating tablet Take 1 tablet (4 mg total) by mouth every 8 (eight) hours as needed for nausea or vomiting. 20 tablet 0   Current Facility-Administered Medications  Medication Dose Route Frequency Provider Last Rate Last Admin   0.9 %  sodium chloride infusion  500 mL Intravenous Once Imogene Burn, MD        Allergies as of 07/17/2023 - Review Complete 07/17/2023  Allergen  Reaction Noted   Peanut-containing drug products Anaphylaxis and Nausea And Vomiting 01/23/2013    Family History  Problem Relation Age of Onset   Celiac disease Sister    Other Sister        allergic to peanuts   Colon cancer Neg Hx    Esophageal cancer Neg Hx    Inflammatory bowel disease Neg Hx    Liver disease Neg Hx    Pancreatic cancer Neg Hx    Rectal cancer Neg Hx    Stomach cancer Neg Hx     Social History   Socioeconomic History   Marital status: Single    Spouse name: Not on file   Number of children: 0   Years of education: Not on file   Highest  education level: Not on file  Occupational History   Occupation: college student  Tobacco Use   Smoking status: Never   Smokeless tobacco: Never  Vaping Use   Vaping status: Never Used  Substance and Sexual Activity   Alcohol use: No   Drug use: No   Sexual activity: Not on file  Other Topics Concern   Not on file  Social History Narrative   Not on file   Social Drivers of Health   Financial Resource Strain: Not on file  Food Insecurity: Not on file  Transportation Needs: Not on file  Physical Activity: Not on file  Stress: Not on file  Social Connections: Not on file  Intimate Partner Violence: Not on file    Physical Exam: Vital signs in last 24 hours: BP 110/73   Pulse 70   Temp 97.8 F (36.6 C)   Ht 5\' 8"  (1.727 m)   Wt 150 lb (68 kg)   LMP 06/20/2023   SpO2 99%   BMI 22.81 kg/m  GEN: NAD EYE: Sclerae anicteric ENT: MMM CV: Non-tachycardic Pulm: No increased WOB GI: Soft NEURO:  Alert & Oriented   Eulah Pont, MD [facility]   07/17/2023 2:00 PM

## 2023-07-17 NOTE — Op Note (Signed)
[facility] Patient Name: Lindsay Lester Procedure Date: 07/17/2023 1:47 PM MRN: [id number] Endoscopist: Madelyn Brunner [place] , , [id number] Age: 17 Referring MD:  Date of Birth: 24-Apr-2006 Gender: Female Account #: [id number] Procedure:                Colonoscopy Indications:              Change in bowel habits Medicines:                Monitored Anesthesia Care Procedure:                Pre-Anesthesia Assessment:                           - Prior to the procedure, a History and Physical                            was performed, and patient medications and                            allergies were reviewed. The patient's tolerance of                            previous anesthesia was also reviewed. The risks                            and benefits of the procedure and the sedation                            options and risks were discussed with the patient.                            All questions were answered, and informed consent                            was obtained. Prior Anticoagulants: The patient has                            taken no anticoagulant or antiplatelet agents. ASA                            Grade Assessment: II - A patient with mild systemic                            disease. After reviewing the risks and benefits,                            the patient was deemed in satisfactory condition to                            undergo the procedure.                           After obtaining informed consent, the colonoscope  was passed under direct vision. Throughout the                            procedure, the patient's blood pressure, pulse, and                            oxygen saturations were monitored continuously. The                            [id number]-HQ190L Colonoscope [id number] was introduced                            through the anus and advanced to the the terminal                            ileum. The colonoscopy was  performed without                            difficulty. The patient tolerated the procedure                            well. The quality of the bowel preparation was                            excellent. The terminal ileum, ileocecal valve,                            appendiceal orifice, and rectum were photographed. Scope In: 2:15:16 PM Scope Out: 2:27:08 PM Scope Withdrawal Time: 0 hours 8 minutes 55 seconds  Total Procedure Duration: 0 hours 11 minutes 52 seconds  Findings:                 The terminal ileum appeared normal.                           A 4 mm polyp was found in the sigmoid colon. The                            polyp was sessile. The polyp was removed with a                            cold snare. Resection and retrieval were complete.                           Non-bleeding internal hemorrhoids were found during                            retroflexion.                           Biopsies for histology were taken with a cold                            forceps from the entire colon for evaluation of  microscopic colitis. Complications:            No immediate complications. Estimated Blood Loss:     Estimated blood loss was minimal. Impression:               - The examined portion of the ileum was normal.                           - One 4 mm polyp in the sigmoid colon, removed with                            a cold snare. Resected and retrieved.                           - Non-bleeding internal hemorrhoids.                           - Biopsies were taken with a cold forceps from the                            entire colon for evaluation of microscopic colitis. Recommendation:           - Discharge patient to home (with escort).                           - Await pathology results.                           - Patient was noted to have some decreased anal                            sphincter tone. I would recommend a referral to                             pelvic floor physical therapy.                           - The findings and recommendations were discussed                            with the patient. Dr Particia Lather "Alan Ripper" [place],  07/17/2023 2:35:23 PM

## 2023-07-17 NOTE — Progress Notes (Signed)
1403 Robinul 0.1 mg IV given due large amount of secretions upon assessment.  MD made aware, vss

## 2023-07-20 ENCOUNTER — Telehealth: Payer: Self-pay

## 2023-07-20 NOTE — Telephone Encounter (Signed)
Attempted to reach patient for post-procedure f/u call. No answer. Left message for her to please not hesitate to call if she has an questions/concerns regarding her care.

## 2023-07-23 DIAGNOSIS — Z3041 Encounter for surveillance of contraceptive pills: Secondary | ICD-10-CM | POA: Diagnosis not present

## 2023-07-23 DIAGNOSIS — F419 Anxiety disorder, unspecified: Secondary | ICD-10-CM | POA: Diagnosis not present

## 2023-07-23 DIAGNOSIS — K2900 Acute gastritis without bleeding: Secondary | ICD-10-CM | POA: Diagnosis not present

## 2023-07-23 DIAGNOSIS — F32A Depression, unspecified: Secondary | ICD-10-CM | POA: Diagnosis not present

## 2023-07-23 DIAGNOSIS — R110 Nausea: Secondary | ICD-10-CM | POA: Diagnosis not present

## 2023-07-24 ENCOUNTER — Encounter: Payer: Self-pay | Admitting: Internal Medicine

## 2023-07-24 LAB — SURGICAL PATHOLOGY

## 2023-08-12 ENCOUNTER — Other Ambulatory Visit: Payer: Self-pay | Admitting: Internal Medicine

## 2023-08-21 ENCOUNTER — Telehealth: Payer: Self-pay | Admitting: Gastroenterology

## 2023-08-21 NOTE — Telephone Encounter (Signed)
Inbound call from patient's mother, would like to discuss any possible follow up appointments needed after procedure.

## 2023-08-21 NOTE — Telephone Encounter (Signed)
The pt states that she is doing very well and would like to cancel her appt for follow up. Appt has been cancelled and she will call if needed.

## 2023-09-10 ENCOUNTER — Encounter: Payer: Self-pay | Admitting: Gastroenterology

## 2023-09-10 NOTE — Telephone Encounter (Signed)
Patty, I would say that hopefully she is starting to feel better or is feeling better. Let her know that it takes upwards of 4 months at times for full healing in over 90% of patients to have gastric ulcers, I think she would likely fit into this category based on what her ulcers look like at primary endoscopy. In regards to consumption of sodas or high acidity drinks, I think she can certainly think about adding that back in if she wants to slowly with a few drinks per week to see how she does.  She will continue her PPI therapy for now. As she is not 18 years of age, I cannot say that I would give permission that she could drink alcohol, but I do not think that it would have an impact in any other way that the sodas or high acidity drinks would either.  She would be on PPI therapy for now to help protect her stomach lining. Hopefully that makes sense for her. GM

## 2023-09-10 NOTE — Telephone Encounter (Signed)
Dr Meridee Score see the message below from the pt.

## 2023-10-06 ENCOUNTER — Other Ambulatory Visit: Payer: Self-pay | Admitting: Internal Medicine

## 2023-11-20 ENCOUNTER — Ambulatory Visit: Payer: BC Managed Care – PPO | Admitting: Gastroenterology

## 2023-12-12 ENCOUNTER — Other Ambulatory Visit: Payer: Self-pay | Admitting: Internal Medicine

## 2024-01-22 ENCOUNTER — Telehealth: Payer: Self-pay | Admitting: Gastroenterology

## 2024-01-22 NOTE — Telephone Encounter (Signed)
 Patient mother called and stated that she was wanting to know if it was still a good idea for her daughter to still have a follow up with Dr. Wilhelmenia even though she is not having any problems at this time. Patient mother stated that she would like a call back form the nurse to better advise her on what to do.  A good contact number for  patient mother is [phone] Please advise.

## 2024-01-22 NOTE — Telephone Encounter (Signed)
 The pt has been advised that she should follow up at least once a year for med refills. The pt has been advised of the information and verbalized understanding.

## 2024-01-27 DIAGNOSIS — Z68.41 Body mass index (BMI) pediatric, 5th percentile to less than 85th percentile for age: Secondary | ICD-10-CM | POA: Diagnosis not present

## 2024-01-27 DIAGNOSIS — F419 Anxiety disorder, unspecified: Secondary | ICD-10-CM | POA: Diagnosis not present

## 2024-01-27 DIAGNOSIS — F339 Major depressive disorder, recurrent, unspecified: Secondary | ICD-10-CM | POA: Diagnosis not present

## 2024-01-27 DIAGNOSIS — Z130 Encounter for screening for diseases of the blood and blood-forming organs and certain disorders involving the immune mechanism: Secondary | ICD-10-CM | POA: Diagnosis not present

## 2024-01-27 DIAGNOSIS — Z0001 Encounter for general adult medical examination with abnormal findings: Secondary | ICD-10-CM | POA: Diagnosis not present

## 2024-02-23 ENCOUNTER — Other Ambulatory Visit: Payer: Self-pay | Admitting: Internal Medicine

## 2024-03-07 DIAGNOSIS — J4530 Mild persistent asthma, uncomplicated: Secondary | ICD-10-CM | POA: Diagnosis not present

## 2024-03-07 DIAGNOSIS — F324 Major depressive disorder, single episode, in partial remission: Secondary | ICD-10-CM | POA: Diagnosis not present

## 2024-03-07 DIAGNOSIS — F419 Anxiety disorder, unspecified: Secondary | ICD-10-CM | POA: Diagnosis not present

## 2024-03-17 ENCOUNTER — Other Ambulatory Visit: Payer: Self-pay | Admitting: Gastroenterology

## 2024-04-02 DIAGNOSIS — R197 Diarrhea, unspecified: Secondary | ICD-10-CM | POA: Diagnosis not present

## 2024-04-02 DIAGNOSIS — R110 Nausea: Secondary | ICD-10-CM | POA: Diagnosis not present

## 2024-04-02 DIAGNOSIS — R109 Unspecified abdominal pain: Secondary | ICD-10-CM | POA: Diagnosis not present

## 2024-04-02 DIAGNOSIS — Z3202 Encounter for pregnancy test, result negative: Secondary | ICD-10-CM | POA: Diagnosis not present

## 2024-04-04 ENCOUNTER — Telehealth: Payer: Self-pay | Admitting: Gastroenterology

## 2024-04-04 ENCOUNTER — Other Ambulatory Visit: Payer: Self-pay | Admitting: Gastroenterology

## 2024-04-04 NOTE — Telephone Encounter (Signed)
 Ro are you working on this?

## 2024-04-04 NOTE — Telephone Encounter (Signed)
 Inbound call from patients father stating she is in need of a refill on medication Bentyl . And would like to know if it can be sent to [facility] on [address] [place] [address]. Requesting a call back  Please advise  Thank you

## 2024-04-04 NOTE — Telephone Encounter (Signed)
 The pt father has been advised that the bentyl  was not prescribed by our office.  He will call the prescriber and discuss with the pt. She is currently in  [facility] and can not f/u with our office.

## 2024-04-04 NOTE — Telephone Encounter (Signed)
 It was not prescribed by our office. Unless I overlooked something, I didn't see that Dr Wilhelmenia prescribed this.

## 2024-04-04 NOTE — Telephone Encounter (Signed)
 PT father is returning call. Stated that [facility] is saying Dr. Wilhelmenia is not covered with our facility and that they would need new prescription. Please advise.

## 2024-04-05 ENCOUNTER — Encounter: Payer: Self-pay | Admitting: Gastroenterology

## 2024-04-06 ENCOUNTER — Other Ambulatory Visit: Payer: Self-pay

## 2024-04-06 MED ORDER — DICYCLOMINE HCL 20 MG PO TABS: 20.0000 mg | ORAL_TABLET | Freq: Three times a day (TID) | ORAL | 1 refills | Status: AC

## 2024-04-06 NOTE — Telephone Encounter (Signed)
 Let's plan Bentyl  20 mg TID PRN (60/1) for pain/spasms for now. Ideally she tries to take as needed, but she could use daily at least once to try and be proactive. I think during an episode as well, if she is having lots of acid indigestion/burning discomfort we could also consider a short trial of Carafate for a couple of weeks with history of erosive gastritis. In future, if we consider repeat EGD and there is no longer inflammation, then one may consider functional abdominal pain and consider role of TCA. Thanks. GM

## 2024-04-11 NOTE — Telephone Encounter (Signed)
 Dr Wilhelmenia the pt would like to try the carafate please advise
